# Patient Record
Sex: Male | Born: 2005 | Race: White | Hispanic: No | Marital: Single | Smoking: Never smoker
Health system: Southern US, Community
[De-identification: ages and names within clinical notes are randomized; demographics above are authoritative.]

## PROBLEM LIST (undated history)

## (undated) DIAGNOSIS — J302 Other seasonal allergic rhinitis: Secondary | ICD-10-CM

## (undated) HISTORY — DX: Other seasonal allergic rhinitis: J30.2

## (undated) HISTORY — PX: OTHER SURGICAL HISTORY: SHX169

---
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
## 2024-02-03 DIAGNOSIS — F411 Generalized anxiety disorder: Secondary | ICD-10-CM | POA: Diagnosis not present

## 2024-02-03 DIAGNOSIS — F422 Mixed obsessional thoughts and acts: Secondary | ICD-10-CM | POA: Diagnosis not present

## 2024-02-03 DIAGNOSIS — F1219 Cannabis abuse with unspecified cannabis-induced disorder: Secondary | ICD-10-CM | POA: Diagnosis not present

## 2024-03-29 ENCOUNTER — Telehealth: Payer: Self-pay | Admitting: Family Medicine

## 2024-03-29 NOTE — Telephone Encounter (Signed)
 I am writing a letter to patient's employer with patient's permission  He was treated by virtual clinic for influenza yesterday 03/28/2024.  I was sent confirmatory pictures of his prescriptions for Tamiflu that was prescribed by Chiquita Pinal, NP with the prescription being filled filled by [facility].  He has been unable to get a work note from that clinic.  I have been his primary care physician since 2021 and I am supportive of him taking up to 5 days off before returning to work unless he begins to feel better sooner.  Thanks, Garnette Lukes

## 2024-06-10 ENCOUNTER — Telehealth: Admitting: Family

## 2024-06-10 DIAGNOSIS — K0889 Other specified disorders of teeth and supporting structures: Secondary | ICD-10-CM | POA: Diagnosis not present

## 2024-06-10 MED ORDER — NAPROXEN 500 MG PO TABS: 500.0000 mg | ORAL_TABLET | Freq: Two times a day (BID) | ORAL | 0 refills | Status: AC

## 2024-06-10 NOTE — Progress Notes (Signed)
 " Virtual Visit Consent   [name], you are scheduled for a virtual visit with a [name] provider today. Just as with appointments in the office, your consent must be obtained to participate. Your consent will be active for this visit and any virtual visit you may have with one of our providers in the next 365 days. If you have a MyChart account, a copy of this consent can be sent to you electronically.  As this is a virtual visit, video technology does not allow for your provider to perform a traditional examination. This may limit your provider's ability to fully assess your condition. If your provider identifies any concerns that need to be evaluated in person or the need to arrange testing (such as labs, EKG, etc.), we will make arrangements to do so. Although advances in technology are sophisticated, we cannot ensure that it will always work on either your end or our end. If the connection with a video visit is poor, the visit may have to be switched to a telephone visit. With either a video or telephone visit, we are not always able to ensure that we have a secure connection.  By engaging in this virtual visit, you consent to the provision of healthcare and authorize for your insurance to be billed (if applicable) for the services provided during this visit. Depending on your insurance coverage, you may receive a charge related to this service.  I need to obtain your verbal consent now. Are you willing to proceed with your visit today? [name] has provided verbal consent on 06/10/2024 for a virtual visit (video or telephone). [name], FNP  Date: 06/10/2024 6:33 PM   Virtual Visit via Video Note   I, [name], connected with  Reginald Ellis  ([id number], 2005-06-16) on 06/10/24 at  6:30 PM EST by a video-enabled telemedicine application and verified that I am speaking with the correct person using two identifiers.  Location: Patient: Virtual Visit  Location Patient: Home Provider: Virtual Visit Location Provider: Home Office   I discussed the limitations of evaluation and management by telemedicine and the availability of in person appointments. The patient expressed understanding and agreed to proceed.    History of Present Illness: Reginald Ellis is a 19 y.o. who identifies as a male who was assigned male at birth, and is being seen today for left lower dental pain that started several months ago. Feels like his wisdom tooth is breaking through the skin.  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  Resting comfortably  at home.  Head is normocephalic, atraumatic.  No labored breathing.  Speech is clear and coherent with logical content.  Patient is alert and oriented at baseline.  Left wisdom tooth breaking through gum  Assessment and Plan: 1. Pain, dental (Primary) - naproxen  (NAPROSYN ) 500 MG tablet; Take 1 tablet (500 mg total) by mouth 2 (two) times daily with a meal.  Dispense: 30 tablet; Refill: 0  Start naprosyn  BID with food Rinse mouth after eating Make follow up with dentist Report any fevers, erythemas, increased pain, or discharge  Follow Up Instructions: I discussed the assessment and treatment plan with the patient.  The patient was provided an opportunity to ask questions and all were answered. The patient agreed with the plan and demonstrated an understanding of the instructions.  A copy of instructions were sent to the patient via MyChart unless otherwise noted below.   Patient has requested to receive PHI (AVS, Work Notes, etc)  pertaining to this video visit through e-mail as they are currently without active MyChart. They have voiced understand that email is not considered secure and their health information could be viewed by someone other than the patient.   The patient was advised to call back or seek an in-person evaluation if the symptoms worsen or if the condition fails to improve as anticipated.    [name], FNP    [1] No Known Allergies [2]  Current Outpatient Medications:    naproxen  (NAPROSYN ) 500 MG tablet, Take 1 tablet (500 mg total) by mouth 2 (two) times daily with a meal., Disp: 30 tablet, Rfl: 0  "
